# Patient Record
Sex: Female | Born: 1950 | Race: Black or African American | Hispanic: No | Marital: Single | Smoking: Never smoker
Health system: Southern US, Community
[De-identification: ages and names within clinical notes are randomized; demographics above are authoritative.]

## PROBLEM LIST (undated history)

## (undated) DIAGNOSIS — E119 Type 2 diabetes mellitus without complications: Secondary | ICD-10-CM

## (undated) DIAGNOSIS — K219 Gastro-esophageal reflux disease without esophagitis: Secondary | ICD-10-CM

## (undated) DIAGNOSIS — M1990 Unspecified osteoarthritis, unspecified site: Secondary | ICD-10-CM

## (undated) DIAGNOSIS — I10 Essential (primary) hypertension: Secondary | ICD-10-CM

---

## 2005-01-05 ENCOUNTER — Emergency Department: Payer: Self-pay | Admitting: General Practice

## 2006-03-03 ENCOUNTER — Emergency Department: Payer: Self-pay | Admitting: Emergency Medicine

## 2006-10-12 ENCOUNTER — Ambulatory Visit: Payer: Self-pay | Admitting: Family Medicine

## 2007-01-26 ENCOUNTER — Encounter: Payer: Self-pay | Admitting: Family Medicine

## 2007-02-04 ENCOUNTER — Encounter: Payer: Self-pay | Admitting: Family Medicine

## 2008-01-20 ENCOUNTER — Ambulatory Visit: Payer: Self-pay | Admitting: Family Medicine

## 2008-11-14 ENCOUNTER — Ambulatory Visit: Payer: Self-pay | Admitting: Gastroenterology

## 2009-10-06 HISTORY — PX: CARDIAC CATHETERIZATION: SHX172

## 2010-09-05 ENCOUNTER — Ambulatory Visit: Payer: Self-pay | Admitting: Cardiology

## 2017-11-13 ENCOUNTER — Other Ambulatory Visit: Payer: Self-pay | Admitting: Family Medicine

## 2017-11-13 ENCOUNTER — Ambulatory Visit
Admission: RE | Admit: 2017-11-13 | Discharge: 2017-11-13 | Disposition: A | Discharge status: Home / Self Care | Payer: Medicare (Managed Care) | Source: Ambulatory Visit | Attending: Family Medicine | Admitting: Family Medicine

## 2017-11-13 DIAGNOSIS — M79671 Pain in right foot: Secondary | ICD-10-CM

## 2017-11-26 ENCOUNTER — Other Ambulatory Visit: Payer: Self-pay | Admitting: Family Medicine

## 2017-11-26 ENCOUNTER — Ambulatory Visit
Admission: RE | Admit: 2017-11-26 | Discharge: 2017-11-26 | Disposition: A | Discharge status: Home / Self Care | Payer: Medicare (Managed Care) | Source: Ambulatory Visit | Attending: Family Medicine | Admitting: Family Medicine

## 2017-11-26 DIAGNOSIS — M79671 Pain in right foot: Secondary | ICD-10-CM

## 2018-08-25 IMAGING — CR DG FOOT COMPLETE 3+V*R*
3 series · 3 of 3 positions shown · non-contrast
Comparison: None.

CLINICAL DATA: Sharp pain following walking 1 week ago, initial
encounter

EXAM:
RIGHT FOOT COMPLETE - 3+ VIEW

[foot ap]
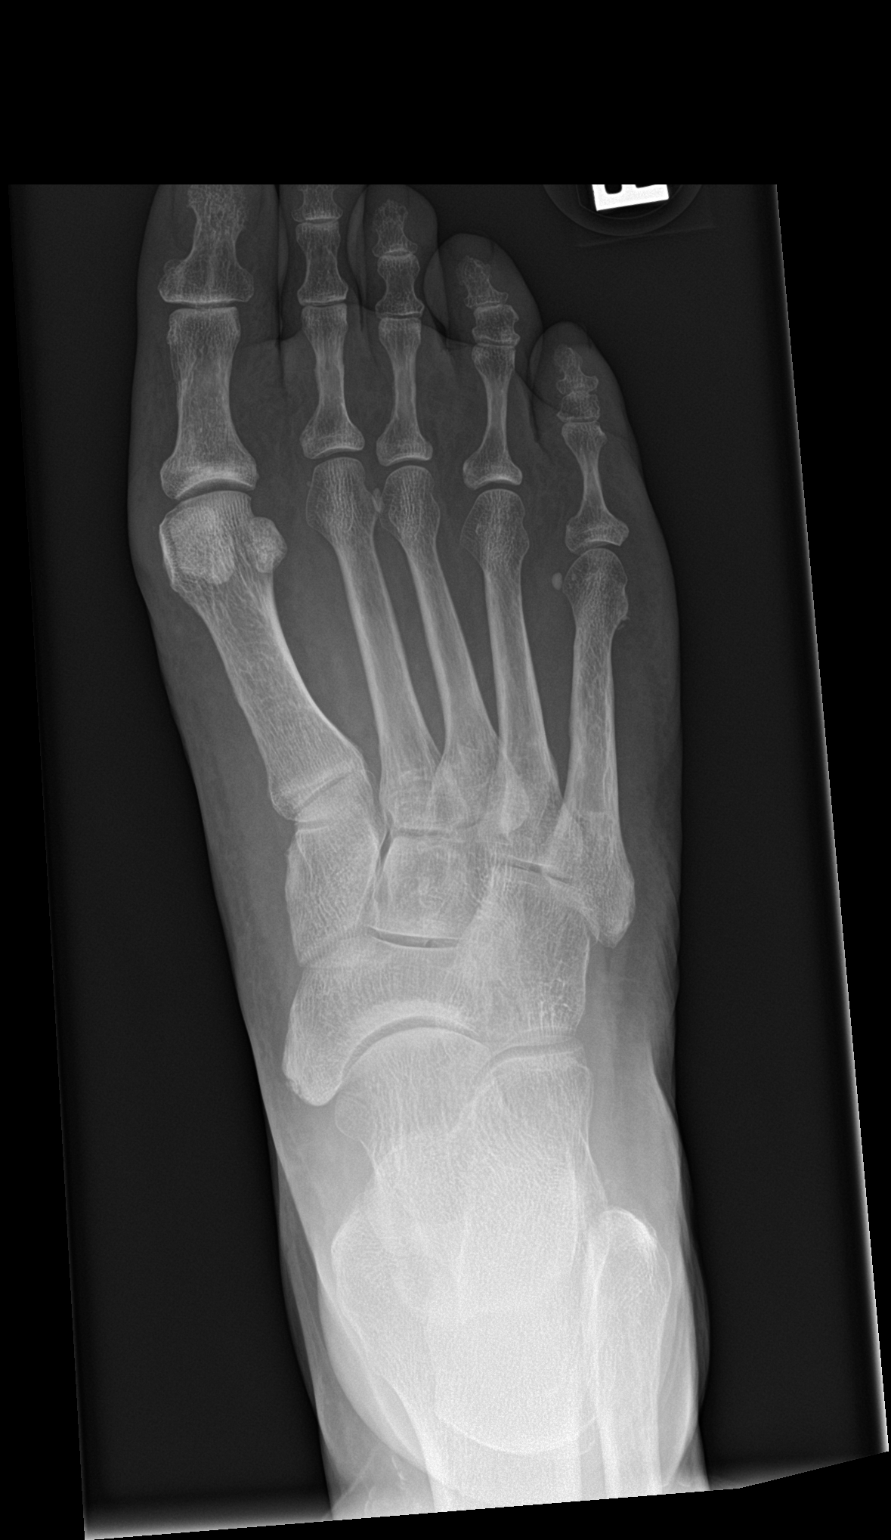

[foot obl]
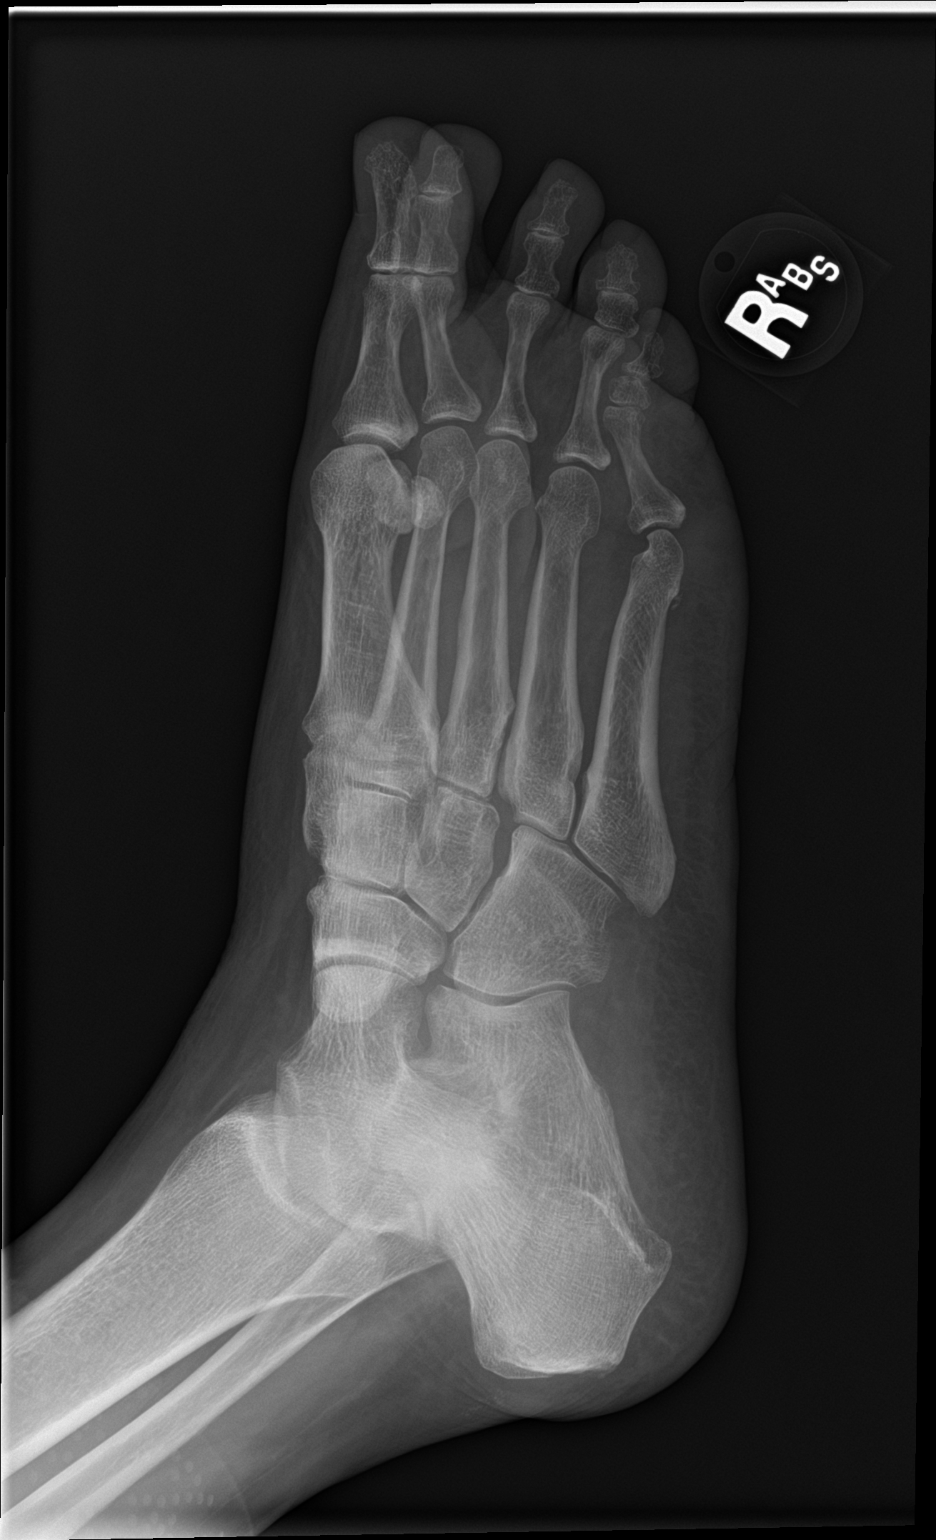

[foot lat]
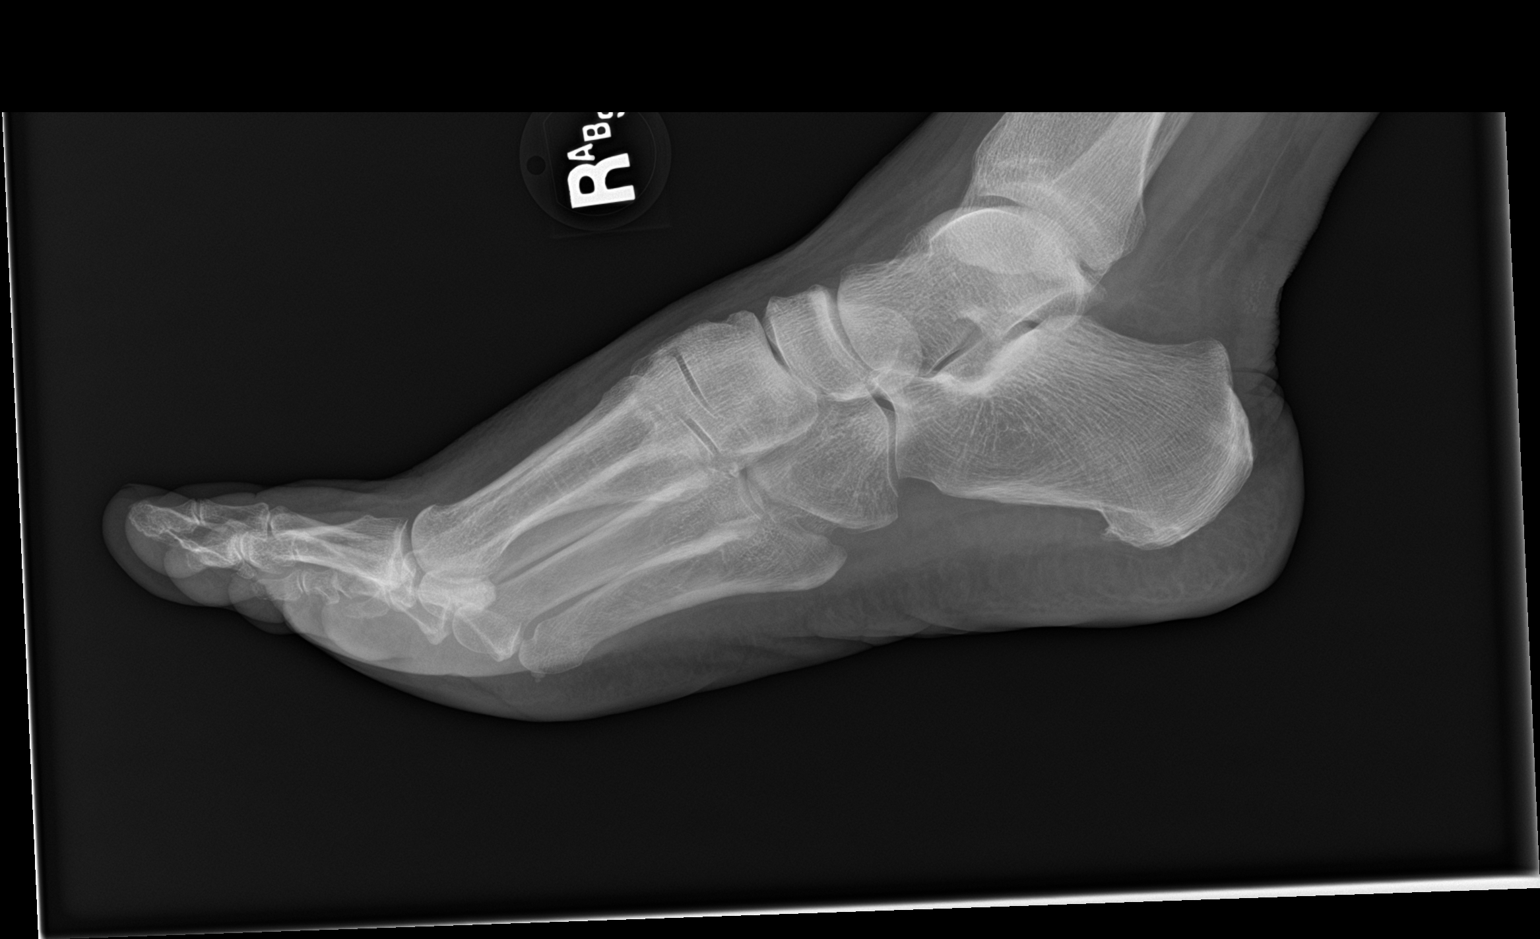

[3 of 3 positions shown; findings below may reference images not displayed]

FINDINGS: There is no evidence of fracture or dislocation. There is no
evidence of arthropathy or other focal bone abnormality. Soft
tissues are unremarkable.
IMPRESSION: No acute abnormality noted.

## 2021-02-04 ENCOUNTER — Other Ambulatory Visit: Payer: Self-pay | Admitting: Adult Health

## 2021-02-04 DIAGNOSIS — Z1231 Encounter for screening mammogram for malignant neoplasm of breast: Secondary | ICD-10-CM

## 2021-03-28 ENCOUNTER — Other Ambulatory Visit: Payer: Self-pay

## 2021-03-28 ENCOUNTER — Ambulatory Visit
Admission: RE | Admit: 2021-03-28 | Discharge: 2021-03-28 | Disposition: A | Discharge status: Home / Self Care | Payer: Medicare (Managed Care) | Source: Ambulatory Visit | Attending: Adult Health | Admitting: Adult Health

## 2021-03-28 DIAGNOSIS — Z1231 Encounter for screening mammogram for malignant neoplasm of breast: Secondary | ICD-10-CM | POA: Insufficient documentation

## 2021-06-14 ENCOUNTER — Encounter: Payer: Self-pay | Admitting: Ophthalmology

## 2021-06-24 NOTE — Discharge Instructions (Signed)

## 2021-06-26 ENCOUNTER — Ambulatory Visit: Payer: Medicare (Managed Care) | Admitting: Anesthesiology

## 2021-06-26 ENCOUNTER — Other Ambulatory Visit: Payer: Self-pay

## 2021-06-26 ENCOUNTER — Encounter
Admission: RE | Disposition: A | Discharge status: Home / Self Care | Payer: Self-pay | Source: Ambulatory Visit | Attending: Ophthalmology

## 2021-06-26 ENCOUNTER — Encounter: Payer: Self-pay | Admitting: Ophthalmology

## 2021-06-26 ENCOUNTER — Ambulatory Visit
Admission: RE | Admit: 2021-06-26 | Discharge: 2021-06-26 | Disposition: A | Discharge status: Home / Self Care | Payer: Medicare (Managed Care) | Source: Ambulatory Visit | Attending: Ophthalmology | Admitting: Ophthalmology

## 2021-06-26 DIAGNOSIS — Z955 Presence of coronary angioplasty implant and graft: Secondary | ICD-10-CM | POA: Insufficient documentation

## 2021-06-26 DIAGNOSIS — Z79899 Other long term (current) drug therapy: Secondary | ICD-10-CM | POA: Diagnosis not present

## 2021-06-26 DIAGNOSIS — H2512 Age-related nuclear cataract, left eye: Secondary | ICD-10-CM | POA: Diagnosis not present

## 2021-06-26 DIAGNOSIS — H5703 Miosis: Secondary | ICD-10-CM | POA: Diagnosis not present

## 2021-06-26 DIAGNOSIS — Z7984 Long term (current) use of oral hypoglycemic drugs: Secondary | ICD-10-CM | POA: Insufficient documentation

## 2021-06-26 DIAGNOSIS — Z7982 Long term (current) use of aspirin: Secondary | ICD-10-CM | POA: Insufficient documentation

## 2021-06-26 DIAGNOSIS — E1136 Type 2 diabetes mellitus with diabetic cataract: Secondary | ICD-10-CM | POA: Insufficient documentation

## 2021-06-26 HISTORY — PX: CATARACT EXTRACTION W/PHACO: SHX586

## 2021-06-26 HISTORY — DX: Gastro-esophageal reflux disease without esophagitis: K21.9

## 2021-06-26 HISTORY — DX: Unspecified osteoarthritis, unspecified site: M19.90

## 2021-06-26 HISTORY — DX: Essential (primary) hypertension: I10

## 2021-06-26 HISTORY — DX: Type 2 diabetes mellitus without complications: E11.9

## 2021-06-26 LAB — GLUCOSE, CAPILLARY
Glucose-Capillary: 116 mg/dL — ABNORMAL HIGH (ref 70–99)
Glucose-Capillary: 76 mg/dL (ref 70–99)
Glucose-Capillary: 83 mg/dL (ref 70–99)

## 2021-06-26 SURGERY — PHACOEMULSIFICATION, CATARACT, WITH IOL INSERTION
Anesthesia: Monitor Anesthesia Care | Site: Eye | Laterality: Left

## 2021-06-26 MED ORDER — SIGHTPATH DOSE#1 BSS IO SOLN
INTRAOCULAR | Status: DC | PRN
  Administered 2021-06-26: 15 mL

## 2021-06-26 MED ORDER — DEXTROSE 50 % IV SOLN
25.0000 mL | Freq: Once | INTRAVENOUS | Status: AC
  Administered 2021-06-26: 25 mL via INTRAVENOUS

## 2021-06-26 MED ORDER — BRIMONIDINE TARTRATE-TIMOLOL 0.2-0.5 % OP SOLN
OPHTHALMIC | Status: DC | PRN
  Administered 2021-06-26: 1 [drp] via OPHTHALMIC

## 2021-06-26 MED ORDER — ACETAMINOPHEN 500 MG PO TABS: 1000.0000 mg | ORAL_TABLET | Freq: Once | ORAL | Status: DC | PRN

## 2021-06-26 MED ORDER — ARMC OPHTHALMIC DILATING DROPS
1.0000 "application " | OPHTHALMIC | Status: DC | PRN
  Administered 2021-06-26 (×3): 1 via OPHTHALMIC

## 2021-06-26 MED ORDER — SIGHTPATH DOSE#1 BSS IO SOLN
INTRAOCULAR | Status: DC | PRN
  Administered 2021-06-26: 106 mL via OPHTHALMIC

## 2021-06-26 MED ORDER — SIGHTPATH DOSE#1 BSS IO SOLN
INTRAOCULAR | Status: DC | PRN
  Administered 2021-06-26: 1 mL via INTRAMUSCULAR

## 2021-06-26 MED ORDER — ACETAMINOPHEN 160 MG/5ML PO SOLN: 975.0000 mg | Freq: Once | ORAL | Status: DC | PRN

## 2021-06-26 MED ORDER — FENTANYL CITRATE (PF) 100 MCG/2ML IJ SOLN
INTRAMUSCULAR | Status: DC | PRN
  Administered 2021-06-26: 50 ug via INTRAVENOUS

## 2021-06-26 MED ORDER — SIGHTPATH DOSE#1 NA HYALUR & NA CHOND-NA HYALUR IO KIT
PACK | INTRAOCULAR | Status: DC | PRN
  Administered 2021-06-26: 1 via OPHTHALMIC

## 2021-06-26 MED ORDER — MOXIFLOXACIN HCL 0.5 % OP SOLN
OPHTHALMIC | Status: DC | PRN
  Administered 2021-06-26: 0.2 mL via OPHTHALMIC

## 2021-06-26 MED ORDER — LACTATED RINGERS IV SOLN: INTRAVENOUS | Status: DC

## 2021-06-26 MED ORDER — MIDAZOLAM HCL 2 MG/2ML IJ SOLN
INTRAMUSCULAR | Status: DC | PRN
  Administered 2021-06-26: 2 mg via INTRAVENOUS

## 2021-06-26 MED ORDER — ONDANSETRON HCL 4 MG/2ML IJ SOLN: 4.0000 mg | Freq: Once | INTRAMUSCULAR | Status: DC | PRN

## 2021-06-26 MED ORDER — TETRACAINE HCL 0.5 % OP SOLN
1.0000 [drp] | OPHTHALMIC | Status: DC | PRN
  Administered 2021-06-26 (×3): 1 [drp] via OPHTHALMIC

## 2021-06-26 SURGICAL SUPPLY — 14 items
GLOVE SRG 8 PF TXTR STRL LF DI (GLOVE) ×1 IMPLANT
GLOVE SURG ENC TEXT LTX SZ7.5 (GLOVE) ×2 IMPLANT
GLOVE SURG UNDER POLY LF SZ8 (GLOVE) ×2
GOWN STRL REUS W/ TWL LRG LVL3 (GOWN DISPOSABLE) ×2 IMPLANT
GOWN STRL REUS W/TWL LRG LVL3 (GOWN DISPOSABLE) ×4
LENS IOL TECNIS EYHANCE 23.5 (Intraocular Lens) ×2 IMPLANT
MARKER SKIN DUAL TIP RULER LAB (MISCELLANEOUS) ×2 IMPLANT
NEEDLE FILTER BLUNT 18X 1/2SAF (NEEDLE) ×2
NEEDLE FILTER BLUNT 18X1 1/2 (NEEDLE) ×2 IMPLANT
RING MALYGIN (MISCELLANEOUS) ×2 IMPLANT
SYR 3ML LL SCALE MARK (SYRINGE) ×4 IMPLANT
SYR TB 1ML LUER SLIP (SYRINGE) ×2 IMPLANT
WATER STERILE IRR 250ML POUR (IV SOLUTION) ×2 IMPLANT
WIPE NON LINTING 3.25X3.25 (MISCELLANEOUS) ×2 IMPLANT

## 2021-06-26 NOTE — Transfer of Care (Signed)
Immediate Anesthesia Transfer of Care Note  Patient: Melissa Norton  Procedure(s) Performed: CATARACT EXTRACTION PHACO AND INTRAOCULAR LENS PLACEMENT ([place]) LEFT MALYUGIN (Left: Eye)  Patient Location: PACU  Anesthesia Type: MAC  Level of Consciousness: awake, alert  and patient cooperative  Airway and Oxygen Therapy: Patient Spontanous Breathing and Patient connected to supplemental oxygen  Post-op Assessment: Post-op Vital signs reviewed, Patient's Cardiovascular Status Stable, Respiratory Function Stable, Patent Airway and No signs of Nausea or vomiting  Post-op Vital Signs: Reviewed and stable  Complications: No notable events documented.

## 2021-06-26 NOTE — Anesthesia Procedure Notes (Signed)
Procedure Name: MAC Date/Time: 06/26/2021 8:44 AM Performed by: Jeannene Patella, CRNA Pre-anesthesia Checklist: Patient identified, Emergency Drugs available, Suction available, Timeout performed and Patient being monitored Patient Re-evaluated:Patient Re-evaluated prior to induction Oxygen Delivery Method: Nasal cannula Placement Confirmation: positive ETCO2

## 2021-06-26 NOTE — Anesthesia Preprocedure Evaluation (Signed)
Anesthesia Evaluation  Patient identified by MRN, date of birth, ID band Patient awake    Reviewed: Allergy & Precautions, H&P , NPO status , Patient's Chart, lab work & pertinent test results, reviewed documented beta blocker date and time   Airway Mallampati: II  TM Distance: >3 FB Neck ROM: full    Dental no notable dental hx.    Pulmonary neg pulmonary ROS,    Pulmonary exam normal breath sounds clear to auscultation       Cardiovascular Exercise Tolerance: Good hypertension, + Cardiac Stents   Rhythm:regular Rate:Normal     Neuro/Psych negative neurological ROS  negative psych ROS   GI/Hepatic Neg liver ROS, GERD  ,  Endo/Other  negative endocrine ROSdiabetes  Renal/GU negative Renal ROS  negative genitourinary   Musculoskeletal   Abdominal   Peds  Hematology negative hematology ROS (+)   Anesthesia Other Findings   Reproductive/Obstetrics negative OB ROS                             Anesthesia Physical Anesthesia Plan  ASA: 2  Anesthesia Plan: MAC   Post-op Pain Management:    Induction:   PONV Risk Score and Plan:   Airway Management Planned:   Additional Equipment:   Intra-op Plan:   Post-operative Plan:   Informed Consent: I have reviewed the patients History and Physical, chart, labs and discussed the procedure including the risks, benefits and alternatives for the proposed anesthesia with the patient or authorized representative who has indicated his/her understanding and acceptance.     Dental Advisory Given  Plan Discussed with: CRNA  Anesthesia Plan Comments:         Anesthesia Quick Evaluation

## 2021-06-26 NOTE — Anesthesia Postprocedure Evaluation (Addendum)
Anesthesia Post Note  Patient: Melissa Norton  Procedure(s) Performed: CATARACT EXTRACTION PHACO AND INTRAOCULAR LENS PLACEMENT ([place]) LEFT MALYUGIN (Left: Eye)     Patient location during evaluation: PACU Anesthesia Type: MAC Level of consciousness: awake and alert Pain management: pain level controlled Vital Signs Assessment: post-procedure vital signs reviewed and stable Respiratory status: spontaneous breathing, nonlabored ventilation and respiratory function stable Cardiovascular status: stable and blood pressure returned to baseline Postop Assessment: no apparent nausea or vomiting Anesthetic complications: no   No notable events documented.  April Manson

## 2021-06-26 NOTE — Progress Notes (Signed)
09:22-Post-procedure glucose 83. Patient is alert and oriented, drank approximately 100 ml regular ginger ale.  Patient instructed to eat upon arrival at home.  Patient and granddaughter verbalized understanding.

## 2021-06-26 NOTE — H&P (Signed)
[facility]   Primary Care Physician:  Malachi Paradise, NP Ophthalmologist: Dr. Lockie Mola  Pre-Procedure History & Physical: HPI:  Melissa Norton is a 70 y.o. female here for ophthalmic surgery.   Past Medical History:  Diagnosis Date   Arthritis    hands and knees   Diabetes mellitus without complication (HCC)    GERD (gastroesophageal reflux disease)    Hypertension     Past Surgical History:  Procedure Laterality Date   CARDIAC CATHETERIZATION  2011   1 stenet    Prior to Admission medications   Medication Sig Start Date End Date Taking? Authorizing Provider  acetaminophen (TYLENOL) 325 MG tablet Take 650 mg by mouth every 8 (eight) hours as needed.   Yes [provider]  aspirin EC 81 MG tablet Take 81 mg by mouth daily. Swallow whole.   Yes [provider]  atorvastatin (LIPITOR) 80 MG tablet Take 80 mg by mouth at bedtime.   Yes [provider]  cetirizine (ZYRTEC) 10 MG tablet Take 10 mg by mouth daily.   Yes [provider]  Cholecalciferol (D3-1000) 25 MCG (1000 UT) capsule Take 1,000 Units by mouth daily.   Yes [provider]  dorzolamide-timolol (COSOPT) 22.3-6.8 MG/ML ophthalmic solution Place 1 drop into both eyes 2 (two) times daily.   Yes [provider]  enalapril (VASOTEC) 10 MG tablet Take 10 mg by mouth daily.   Yes [provider]  fluticasone (FLONASE) 50 MCG/ACT nasal spray Place 2 sprays into both nostrils daily.   Yes [provider]  gabapentin (NEURONTIN) 100 MG capsule Take 100 mg by mouth daily.   Yes [provider]  glipiZIDE (GLUCOTROL) 5 MG tablet Take 5 mg by mouth daily before breakfast.   Yes [provider]  hydrochlorothiazide (HYDRODIURIL) 25 MG tablet Take 25 mg by mouth daily.   Yes [provider]  latanoprost (XALATAN) 0.005 % ophthalmic solution Place 1 drop into both eyes at bedtime.   Yes [provider]   metFORMIN (GLUCOPHAGE) 500 MG tablet Take 500 mg by mouth 2 (two) times daily with a meal.   Yes [provider]  mirabegron ER (MYRBETRIQ) 25 MG TB24 tablet Take 25 mg by mouth daily.   Yes [provider]  omeprazole (PRILOSEC) 20 MG capsule Take 20 mg by mouth daily.   Yes [provider]  Semaglutide,0.25 or 0.5MG /DOS, (OZEMPIC, 0.25 OR 0.5 MG/DOSE,) 2 MG/1.5ML SOPN Inject 0.25 mg into the skin once a week.   Yes [provider]    Allergies as of 05/29/2021   (Not on File)    Family History  Problem Relation Age of Onset   Breast cancer Neg Hx     Social History   Socioeconomic History   Marital status: Single    Spouse name: Not on file   Number of children: Not on file   Years of education: Not on file   Highest education level: Not on file  Occupational History   Not on file  Tobacco Use   Smoking status: Never   Smokeless tobacco: Never  Substance and Sexual Activity   Alcohol use: Yes    Comment: wine occasionally   Drug use: Not on file   Sexual activity: Not on file  Other Topics Concern   Not on file  Social History Narrative   Not on file   Social Determinants of Health   Financial Resource Strain: Not on file  Food Insecurity: Not on  file  Transportation Needs: Not on file  Physical Activity: Not on file  Stress: Not on file  Social Connections: Not on file  Intimate Partner Violence: Not on file    Review of Systems: See HPI, otherwise negative ROS  Physical Exam: BP 130/64   Pulse 79   Temp 98.1 F (36.7 C) (Temporal)   Ht 5\' 2"  (1.575 m)   Wt 77.6 kg   SpO2 98%   BMI 31.28 kg/m  General:   Alert,  pleasant and cooperative in NAD Head:  Normocephalic and atraumatic. Lungs:  Clear to auscultation.    Heart:  Regular rate and rhythm.   Impression/Plan: Melissa Norton is here for ophthalmic surgery.  Risks, benefits, limitations, and alternatives regarding ophthalmic surgery have been  reviewed with the patient.  Questions have been answered.  All parties agreeable.   Arley Phenix, MD  06/26/2021, 8:08 AM

## 2021-06-26 NOTE — Op Note (Signed)
OPERATIVE NOTE  Melissa Norton [id number] 06/26/2021  PREOPERATIVE DIAGNOSIS:   Nuclear sclerotic cataract left eye with miotic pupil      H25.12   POSTOPERATIVE DIAGNOSIS:   Nuclear sclerotic cataract left eye with miotic pupil.     PROCEDURE:  Phacoemulsification with posterior chamber intraocular lens implantation of the left eye which required pupil stretching with the Malyugin pupil expansion device  Ultrasound time: Procedure(s) with comments: CATARACT EXTRACTION PHACO AND INTRAOCULAR LENS PLACEMENT ([facility]) LEFT MALYUGIN (Left) - 7.76 01:23.5  LENS:   Implant Name Type Inv. Item Serial No. Manufacturer Lot No. LRB No. Used Action  LENS IOL TECNIS EYHANCE 23.5 - [id number] Intraocular Lens LENS IOL TECNIS EYHANCE 23.5 [id number] JOHNSON   Left 1 Implanted     SURGEON:  Deirdre Evener, MD   ANESTHESIA: Topical with tetracaine drops and 2% Xylocaine jelly, augmented with 1% preservative-free intracameral lidocaine.   COMPLICATIONS:  None.   DESCRIPTION OF PROCEDURE:  The patient was identified in the holding room and transported to the operating room and placed in the supine position under the operating microscope.  The left eye was identified as the operative eye and it was prepped and draped in the usual sterile ophthalmic fashion.   A 1 millimeter clear-corneal paracentesis was made at the 1:30 position.  The anterior chamber was filled with Viscoat viscoelastic.  0.5 ml of preservative-free 1% lidocaine was injected into the anterior chamber.  A 2.4 millimeter keratome was used to make a near-clear corneal incision at the 10:30 position.  A Malyugin pupil expander was then placed through the main incision and into the anterior chamber of the eye.  The edge of the iris was secured on the lip of the pupil expander and it was released, thereby expanding the pupil to approximately 7 millimeters for completion of the cataract surgery.  Additional Viscoat was placed in the  anterior chamber.  A cystotome and capsulorrhexis forceps were used to make a curvilinear capsulorrhexis.   Balanced salt solution was used to hydrodissect and hydrodelineate the lens nucleus.   Phacoemulsification was used in stop and chop fashion to remove the lens, nucleus and epinucleus.  The remaining cortex was aspirated using the irrigation aspiration handpiece.  Additional Provisc was placed into the eye to distend the capsular bag for lens placement.  A lens was then injected into the capsular bag.  The pupil expanding ring was removed using a Kuglen hook and insertion device. The remaining viscoelastic was aspirated from the capsular bag and the anterior chamber.  The anterior chamber was filled with balanced salt solution to inflate to a physiologic pressure.   Wounds were hydrated with balanced salt solution.  The anterior chamber was inflated to a physiologic pressure with balanced salt solution.  No wound leaks were noted. Vigamox 0.2 ml of a 1mg  per ml solution was injected into the anterior chamber for a dose of 0.2 mg of intracameral antibiotic at the completion of the case.   Timolol and Brimonidine drops were applied to the eye.  The patient was taken to the recovery room in stable condition without complications of anesthesia or surgery.  [name] 06/26/2021, 9:04 AM

## 2021-06-27 ENCOUNTER — Encounter: Payer: Self-pay | Admitting: Ophthalmology

## 2024-02-19 ENCOUNTER — Other Ambulatory Visit: Payer: Self-pay | Admitting: Family Medicine

## 2024-02-19 DIAGNOSIS — Z1231 Encounter for screening mammogram for malignant neoplasm of breast: Secondary | ICD-10-CM

## 2024-04-06 ENCOUNTER — Ambulatory Visit
Admission: RE | Admit: 2024-04-06 | Discharge: 2024-04-06 | Disposition: A | Discharge status: Home / Self Care | Payer: Medicare (Managed Care) | Source: Ambulatory Visit | Attending: Family Medicine | Admitting: Family Medicine

## 2024-04-06 DIAGNOSIS — Z1231 Encounter for screening mammogram for malignant neoplasm of breast: Secondary | ICD-10-CM | POA: Diagnosis present
